# Patient Record
Sex: Male | Born: 1995 | Race: Black or African American | Hispanic: No | Smoking: Never smoker
Health system: Southern US, Community
[De-identification: ages and names within clinical notes are randomized; demographics above are authoritative.]

---
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
## 2017-09-01 ENCOUNTER — Encounter (HOSPITAL_COMMUNITY): Payer: Self-pay

## 2017-09-01 ENCOUNTER — Ambulatory Visit (HOSPITAL_COMMUNITY)
Admission: EM | Admit: 2017-09-01 | Discharge: 2017-09-01 | Disposition: A | Discharge status: Home / Self Care | Payer: 59 | Attending: Family Medicine | Admitting: Family Medicine

## 2017-09-01 DIAGNOSIS — N50819 Testicular pain, unspecified: Secondary | ICD-10-CM | POA: Diagnosis not present

## 2017-09-01 DIAGNOSIS — S301XXA Contusion of abdominal wall, initial encounter: Secondary | ICD-10-CM

## 2017-09-01 DIAGNOSIS — S3022XA Contusion of scrotum and testes, initial encounter: Secondary | ICD-10-CM

## 2017-09-01 DIAGNOSIS — W2103XA Struck by baseball, initial encounter: Secondary | ICD-10-CM

## 2017-09-01 MED ORDER — NAPROXEN 500 MG PO TABS: 500.0000 mg | ORAL_TABLET | Freq: Two times a day (BID) | ORAL | 0 refills | Status: DC

## 2017-09-01 NOTE — ED Provider Notes (Signed)
[facility]   [id number] 09/01/17 Arrival Time: 1816   SUBJECTIVE:  Randall Bailey is a 21 y.o. male who presents to the urgent care with complaint of  testicle pain since Sunday 08/30/2017 while playing baseball as a catcher at school, a ball hit him in his testicles pt states he was wearing a cup but some how the ball went underneath   Seemed to be improving until he went down to the catcher position today and experienced quite a bit of pain      History reviewed. No pertinent past medical history. History reviewed. No pertinent family history. Social History   Social History  . Marital status: Single    Spouse name: N/A  . Number of children: N/A  . Years of education: N/A   Occupational History  . Not on file.   Social History Main Topics  . Smoking status: Never Smoker  . Smokeless tobacco: Never Used  . Alcohol use No  . Drug use: No  . Sexual activity: Yes    Birth control/ protection: Condom   Other Topics Concern  . Not on file   Social History Narrative  . No narrative on file   Current Meds  Medication Sig  . [DISCONTINUED] ibuprofen (ADVIL,MOTRIN) 600 MG tablet 1 tab PO Q6h x 2 days then Q6h prn   No Known Allergies    ROS: As per HPI, remainder of ROS negative.   OBJECTIVE:   Vitals:   09/01/17 1858  BP: 112/66  Pulse: 61  Resp: 15  Temp: 98.3 F (36.8 C)  TempSrc: Oral  SpO2: 98%     General appearance: alert; no distress Eyes: PERRL; EOMI; conjunctiva normal HENT: normocephalic; atraumatic; TMs normal, canal normal, external ears normal without trauma; nasal mucosa normal; oral mucosa normal Neck: supple Lungs: clear to auscultation bilaterally Heart: regular rate and rhythm Abdomen: soft, non-tender; bowel sounds normal; no masses or organomegaly; no guarding or rebound tenderness Back: no CVA tenderness Extremities: no cyanosis or edema; symmetrical with no gross deformities Skin: warm and dry Neurologic:  normal gait; grossly normal Psychological: alert and cooperative; normal mood and affect  Genitalia: No swelling of the testes. Minimal pain of the right testis. Normal penis. No hernia    Labs:  Results for orders placed or performed during the hospital encounter of 07/10/08  Urinalysis, Routine w reflex microscopic  Result Value Ref Range   Color, Urine YELLOW    APPearance CLEAR    Specific Gravity, Urine 1.030    pH 6.0    Glucose, UA NEGATIVE    Hgb urine dipstick NEGATIVE    Bilirubin Urine NEGATIVE    Ketones, ur NEGATIVE    Protein, ur NEGATIVE    Urobilinogen, UA 1.0    Nitrite NEGATIVE    Leukocytes, UA      NEGATIVE MICROSCOPIC NOT DONE ON URINES WITH NEGATIVE PROTEIN, BLOOD, LEUKOCYTES, NITRITE, OR GLUCOSE <1000 mg/dL.    Labs Reviewed - No data to display  No results found.     ASSESSMENT & PLAN:  1. Contusion of flank or groin, initial encounter     Meds ordered this encounter  Medications  . naproxen (NAPROSYN) 500 MG tablet    Sig: Take 1 tablet (500 mg total) by mouth 2 (two) times daily.    Dispense:  30 tablet    Refill:  0    Reviewed expectations re: course of current medical issues. Questions answered. Outlined signs and symptoms indicating need for more  acute intervention. Patient verbalized understanding. After Visit Summary given.    Procedures:      Elvina Sidle, MD 09/01/17 1610

## 2017-09-01 NOTE — ED Triage Notes (Signed)
Patient presents to [facility] with complaints of testicle pain since Sunday 08/30/2017 while playing football at school a ball hit him in his testicles pt states he was wearing a cup but some how the ball went underneath

## 2019-07-23 ENCOUNTER — Other Ambulatory Visit: Payer: Self-pay

## 2019-07-23 DIAGNOSIS — Z20822 Contact with and (suspected) exposure to covid-19: Secondary | ICD-10-CM

## 2019-07-24 LAB — NOVEL CORONAVIRUS, NAA: SARS-CoV-2, NAA: NOT DETECTED

## 2019-08-18 ENCOUNTER — Emergency Department (HOSPITAL_COMMUNITY): Payer: 59

## 2019-08-18 ENCOUNTER — Emergency Department (HOSPITAL_COMMUNITY)
Admission: EM | Admit: 2019-08-18 | Discharge: 2019-08-18 | Disposition: A | Discharge status: Home / Self Care | Payer: 59 | Attending: Emergency Medicine | Admitting: Emergency Medicine

## 2019-08-18 ENCOUNTER — Other Ambulatory Visit: Payer: Self-pay

## 2019-08-18 ENCOUNTER — Encounter (HOSPITAL_COMMUNITY): Payer: Self-pay | Admitting: Emergency Medicine

## 2019-08-18 DIAGNOSIS — S60410A Abrasion of right index finger, initial encounter: Secondary | ICD-10-CM | POA: Diagnosis not present

## 2019-08-18 DIAGNOSIS — Y929 Unspecified place or not applicable: Secondary | ICD-10-CM | POA: Insufficient documentation

## 2019-08-18 DIAGNOSIS — Y9389 Activity, other specified: Secondary | ICD-10-CM | POA: Insufficient documentation

## 2019-08-18 DIAGNOSIS — S60414A Abrasion of right ring finger, initial encounter: Secondary | ICD-10-CM | POA: Diagnosis not present

## 2019-08-18 DIAGNOSIS — S60412A Abrasion of right middle finger, initial encounter: Secondary | ICD-10-CM | POA: Insufficient documentation

## 2019-08-18 DIAGNOSIS — W2209XA Striking against other stationary object, initial encounter: Secondary | ICD-10-CM | POA: Insufficient documentation

## 2019-08-18 DIAGNOSIS — Z79899 Other long term (current) drug therapy: Secondary | ICD-10-CM | POA: Insufficient documentation

## 2019-08-18 DIAGNOSIS — S62666A Nondisplaced fracture of distal phalanx of right little finger, initial encounter for closed fracture: Secondary | ICD-10-CM

## 2019-08-18 DIAGNOSIS — Y999 Unspecified external cause status: Secondary | ICD-10-CM | POA: Diagnosis not present

## 2019-08-18 DIAGNOSIS — S6991XA Unspecified injury of right wrist, hand and finger(s), initial encounter: Secondary | ICD-10-CM | POA: Diagnosis present

## 2019-08-18 NOTE — ED Triage Notes (Signed)
Pt states that he punched a wall and is now complaining of rt hand pain.  Small abrasions noted.

## 2019-08-18 NOTE — Discharge Instructions (Signed)
Take ibuprofen 600 mg every 6 hours as needed for pain. Clean wound with mild soap and warm water. Pat dry and keep clean. Ice your hand over night tonight. This should heal fine and I don't anticipate and long term complications, but I don't recommend you play baseball for 4-6 weeks.

## 2019-08-21 NOTE — ED Provider Notes (Signed)
[facility] DEPT Provider Note   CSN: [id number] Arrival date & time: 08/18/19  2025     History   Chief Complaint Chief Complaint  Patient presents with  . Hand Pain    HPI Randall Bailey is a 23 y.o. male.     HPI   23 year old male with right hand pain after punching a wall.  Pain is primarily in his finger.  Multiple abrasions.  Patient is unsure of tetanus status but is declining update.  Denies any other injuries.  No numbness or tingling.  History reviewed. No pertinent past medical history.  There are no active problems to display for this patient.   No past surgical history on file.      Home Medications    Prior to Admission medications   Medication Sig Start Date End Date Taking? Authorizing Provider  minocycline (MINOCIN,DYNACIN) 50 MG capsule Take 50 mg by mouth 2 (two) times daily.    [provider]  naproxen (NAPROSYN) 500 MG tablet Take 1 tablet (500 mg total) by mouth 2 (two) times daily. 09/01/17   Robyn Haber, MD    Family History No family history on file.  Social History Social History   Tobacco Use  . Smoking status: Never Smoker  . Smokeless tobacco: Never Used  Substance Use Topics  . Alcohol use: No  . Drug use: No     Allergies   Patient has no known allergies.   Review of Systems Review of Systems  All systems reviewed and negative, other than as noted in HPI.  Physical Exam Updated Vital Signs BP 126/87   Pulse 61   Temp 98.2 F (36.8 C) (Oral)   Resp 16   Ht 5\' 10"  (1.778 m)   Wt 76.7 kg   SpO2 100%   BMI 24.25 kg/m   Physical Exam Vitals signs and nursing note reviewed.  Constitutional:      General: He is not in acute distress.    Appearance: He is well-developed.  HENT:     Head: Normocephalic and atraumatic.  Eyes:     General:        Right eye: No discharge.        Left eye: No discharge.     Conjunctiva/sclera: Conjunctivae normal.  Neck:   Musculoskeletal: Neck supple.  Cardiovascular:     Rate and Rhythm: Normal rate and regular rhythm.     Heart sounds: Normal heart sounds. No murmur. No friction rub. No gallop.   Pulmonary:     Effort: Pulmonary effort is normal. No respiratory distress.     Breath sounds: Normal breath sounds.  Abdominal:     General: There is no distension.     Palpations: Abdomen is soft.     Tenderness: There is no abdominal tenderness.  Musculoskeletal:     Comments: Multiple abrasions to the fingers of the right hand.  There is a small hemorrhagic blister over the dorsal aspect of the distal little finger.  Skin is intact though.  Localized enderness.  Skin:    General: Skin is warm and dry.  Neurological:     Mental Status: He is alert.  Psychiatric:        Behavior: Behavior normal.        Thought Content: Thought content normal.      ED Treatments / Results  Labs (all labs ordered are listed, but only abnormal results are displayed) Labs Reviewed - No data to display  EKG  None  Radiology No results found.   Dg Hand Complete Right  Addendum Date: 08/18/2019   ADDENDUM REPORT: 08/18/2019 22:50 ADDENDUM: Upon further review and following discussion with the ordering provider post clinical exam additional findings are as below: There is a displaced, angulated fracture of the fifth distal phalanx with minimal overlying soft tissue swelling. No other fracture or traumatic malalignment. Addendum was called by telephone at the time submission on 08/18/2019 at 10:49 pm to [facility] Spe[facility] , who verbally acknowledged these results. Electronically Signed   By: Kreg ShropshirePrice  DeHay M.D.   On: 08/18/2019 22:50   Result Date: 08/18/2019 CLINICAL DATA:  Punched a wall EXAM: RIGHT HAND - COMPLETE 3+ VIEW COMPARISON:  None. FINDINGS: There is no evidence of fracture or dislocation. There is no evidence of arthropathy or other focal bone abnormality. Soft tissues are unremarkable. IMPRESSION: Negative.  Electronically Signed: By: Kreg ShropshirePrice  DeHay M.D. On: 08/18/2019 20:49    Procedures Procedures (including critical care time)  Medications Ordered in ED Medications - No data to display   Initial Impression / Assessment and Plan / ED Course  I have reviewed the triage vital signs and the nursing notes.  Pertinent labs & imaging results that were available during my care of the patient were reviewed by me and considered in my medical decision making (see chart for details).        23 year old male with right hand/finger pain after punching a wall.  Closed distal phalanx fracture to the little finger.  Neurovascular intact.  Splint.  PRN pain medication.  Outpatient Ortho follow-up.  Final Clinical Impressions(s) / ED Diagnoses   Final diagnoses:  Closed nondisplaced fracture of distal phalanx of right little finger, initial encounter    ED Discharge Orders    None       Raeford RazorKohut, Kaelin Bonelli, MD 08/21/19 1831

## 2019-11-02 ENCOUNTER — Other Ambulatory Visit: Payer: Self-pay

## 2019-11-02 ENCOUNTER — Ambulatory Visit (INDEPENDENT_AMBULATORY_CARE_PROVIDER_SITE_OTHER): Payer: 59 | Admitting: Family Medicine

## 2019-11-02 ENCOUNTER — Encounter: Payer: Self-pay | Admitting: Family Medicine

## 2019-11-02 VITALS — BP 122/79 | HR 56 | Temp 98.8°F | Ht 69.0 in | Wt 162.6 lb

## 2019-11-02 DIAGNOSIS — F411 Generalized anxiety disorder: Secondary | ICD-10-CM | POA: Diagnosis not present

## 2019-11-02 DIAGNOSIS — Z87898 Personal history of other specified conditions: Secondary | ICD-10-CM | POA: Diagnosis not present

## 2019-11-02 DIAGNOSIS — R110 Nausea: Secondary | ICD-10-CM | POA: Diagnosis not present

## 2019-11-02 DIAGNOSIS — G4700 Insomnia, unspecified: Secondary | ICD-10-CM

## 2019-11-02 DIAGNOSIS — F1291 Cannabis use, unspecified, in remission: Secondary | ICD-10-CM

## 2019-11-02 NOTE — Patient Instructions (Addendum)
Melatonin ok at bedtime for now, but follow up with psychiatry as planned this Monday.  Try bedtime of midnight at this time with decreased use of electronic media in the evening.  Try the approach we discussed as far as reading a book if you feel like you cannot sleep.  I will check a few tests for the nausea but would like to discuss that further at follow-up in 2 weeks to decide if gastroenterology visit may also be helpful.  Thank you for coming in to see me today.   Return to the clinic or go to the nearest emergency room if any of your symptoms worsen or new symptoms occur.   Nausea, Adult Nausea is the feeling that you have an upset stomach or that you are about to vomit. Nausea on its own is not usually a serious concern, but it may be an early sign of a more serious medical problem. As nausea gets worse, it can lead to vomiting. If vomiting develops, or if you are not able to drink enough fluids, you are at risk of becoming dehydrated. Dehydration can make you tired and thirsty, cause you to have a dry mouth, and decrease how often you urinate. Older adults and people with other diseases or a weak disease-fighting system (immune system) are at higher risk for dehydration. The main goals of treating your nausea are:  To relieve your nausea.  To limit repeated nausea episodes.  To prevent vomiting and dehydration. Follow these instructions at home: Watch your symptoms for any changes. Tell your health care provider about them. Follow these instructions as told by your health care provider. Eating and drinking      Take an oral rehydration solution (ORS). This is a drink that is sold at pharmacies and retail stores.  Drink clear fluids slowly and in small amounts as you are able. Clear fluids include water, ice chips, low-calorie sports drinks, and fruit juice that has water added (diluted fruit juice).  Eat bland, easy-to-digest foods in small amounts as you are able. These foods  include bananas, applesauce, rice, lean meats, toast, and crackers.  Avoid drinking fluids that contain a lot of sugar or caffeine, such as energy drinks, sports drinks, and soda.  Avoid alcohol.  Avoid spicy or fatty foods. General instructions  Take over-the-counter and prescription medicines only as told by your health care provider.  Rest at home while you recover.  Drink enough fluid to keep your urine pale yellow.  Breathe slowly and deeply when you feel nauseous.  Avoid smelling things that have strong odors.  Wash your hands often using soap and water. If soap and water are not available, use hand sanitizer.  Make sure that all people in your household wash their hands well and often.  Keep all follow-up visits as told by your health care provider. This is important. Contact a health care provider if:  Your nausea gets worse.  Your nausea does not go away after two days.  You vomit.  You cannot drink fluids without vomiting.  You have any of the following: ? New symptoms. ? A fever. ? A headache. ? Muscle cramps. ? A rash. ? Pain while urinating.  You feel light-headed or dizzy. Get help right away if:  You have pain in your chest, neck, arm, or jaw.  You feel extremely weak or you faint.  You have vomit that is bright red or looks like coffee grounds.  You have bloody or black stools or stools  that look like tar.  You have a severe headache, a stiff neck, or both.  You have severe pain, cramping, or bloating in your abdomen.  You have difficulty breathing or are breathing very quickly.  Your heart is beating very quickly.  Your skin feels cold and clammy.  You feel confused.  You have signs of dehydration, such as: ? Dark urine, very little urine, or no urine. ? Cracked lips. ? Dry mouth. ? Sunken eyes. ? Sleepiness. ? Weakness. These symptoms may represent a serious problem that is an emergency. Do not wait to see if the symptoms will  go away. Get medical help right away. Call your local emergency services (911 in the [place] Do not drive yourself to the hospital. Summary  Nausea is the feeling that you have an upset stomach or that you are about to vomit. Nausea on its own is not usually a serious concern, but it may be an early sign of a more serious medical problem.  If vomiting develops, or if you are not able to drink enough fluids, you are at risk of becoming dehydrated.  Follow recommendations for eating and drinking and take over-the-counter and prescription medicines only as told by your health care provider.  Contact a health care provider right away if your symptoms worsen or you have new symptoms.  Keep all follow-up visits as told by your health care provider. This is important. This information is not intended to replace advice given to you by your health care provider. Make sure you discuss any questions you have with your health care provider. Document Released: 01/01/2005 Document Revised: 05/04/2018 Document Reviewed: 05/04/2018 Elsevier Patient Education  2020 [employer]   Insomnia Insomnia is a sleep disorder that makes it difficult to fall asleep or stay asleep. Insomnia can cause fatigue, low energy, difficulty concentrating, mood swings, and poor performance at work or school. There are three different ways to classify insomnia:  Difficulty falling asleep.  Difficulty staying asleep.  Waking up too early in the morning. Any type of insomnia can be long-term (chronic) or short-term (acute). Both are common. Short-term insomnia usually lasts for three months or less. Chronic insomnia occurs at least three times a week for longer than three months. What are the causes? Insomnia may be caused by another condition, situation, or substance, such as:  Anxiety.  Certain medicines.  Gastroesophageal reflux disease (GERD) or other gastrointestinal conditions.  Asthma or other breathing  conditions.  Restless legs syndrome, sleep apnea, or other sleep disorders.  Chronic pain.  Menopause.  Stroke.  Abuse of alcohol, tobacco, or illegal drugs.  Mental health conditions, such as depression.  Caffeine.  Neurological disorders, such as Alzheimer's disease.  An overactive thyroid (hyperthyroidism). Sometimes, the cause of insomnia may not be known. What increases the risk? Risk factors for insomnia include:  Gender. Women are affected more often than men.  Age. Insomnia is more common as you get older.  Stress.  Lack of exercise.  Irregular work schedule or working night shifts.  Traveling between different time zones.  Certain medical and mental health conditions. What are the signs or symptoms? If you have insomnia, the main symptom is having trouble falling asleep or having trouble staying asleep. This may lead to other symptoms, such as:  Feeling fatigued or having low energy.  Feeling nervous about going to sleep.  Not feeling rested in the morning.  Having trouble concentrating.  Feeling irritable, anxious, or depressed. How is this diagnosed? This condition  may be diagnosed based on:  Your symptoms and medical history. Your health care provider may ask about: ? Your sleep habits. ? Any medical conditions you have. ? Your mental health.  A physical exam. How is this treated? Treatment for insomnia depends on the cause. Treatment may focus on treating an underlying condition that is causing insomnia. Treatment may also include:  Medicines to help you sleep.  Counseling or therapy.  Lifestyle adjustments to help you sleep better. Follow these instructions at home: Eating and drinking   Limit or avoid alcohol, caffeinated beverages, and cigarettes, especially close to bedtime. These can disrupt your sleep.  Do not eat a large meal or eat spicy foods right before bedtime. This can lead to digestive discomfort that can make it hard  for you to sleep. Sleep habits   Keep a sleep diary to help you and your health care provider figure out what could be causing your insomnia. Write down: ? When you sleep. ? When you wake up during the night. ? How well you sleep. ? How rested you feel the next day. ? Any side effects of medicines you are taking. ? What you eat and drink.  Make your bedroom a dark, comfortable place where it is easy to fall asleep. ? Put up shades or blackout curtains to block light from outside. ? Use a white noise machine to block noise. ? Keep the temperature cool.  Limit screen use before bedtime. This includes: ? Watching TV. ? Using your smartphone, tablet, or computer.  Stick to a routine that includes going to bed and waking up at the same times every day and night. This can help you fall asleep faster. Consider making a quiet activity, such as reading, part of your nighttime routine.  Try to avoid taking naps during the day so that you sleep better at night.  Get out of bed if you are still awake after 15 minutes of trying to sleep. Keep the lights down, but try reading or doing a quiet activity. When you feel sleepy, go back to bed. General instructions  Take over-the-counter and prescription medicines only as told by your health care provider.  Exercise regularly, as told by your health care provider. Avoid exercise starting several hours before bedtime.  Use relaxation techniques to manage stress. Ask your health care provider to suggest some techniques that may work well for you. These may include: ? Breathing exercises. ? Routines to release muscle tension. ? Visualizing peaceful scenes.  Make sure that you drive carefully. Avoid driving if you feel very sleepy.  Keep all follow-up visits as told by your health care provider. This is important. Contact a health care provider if:  You are tired throughout the day.  You have trouble in your daily routine due to sleepiness.   You continue to have sleep problems, or your sleep problems get worse. Get help right away if:  You have serious thoughts about hurting yourself or someone else. If you ever feel like you may hurt yourself or others, or have thoughts about taking your own life, get help right away. You can go to your nearest emergency department or call:  Your local emergency services (911 in the [place]  A suicide crisis helpline, such as the National Suicide Prevention Lifeline at 475-661-5843. This is open 24 hours a day. Summary  Insomnia is a sleep disorder that makes it difficult to fall asleep or stay asleep.  Insomnia can be long-term (chronic) or short-term (acute).  Treatment for insomnia depends on the cause. Treatment may focus on treating an underlying condition that is causing insomnia.  Keep a sleep diary to help you and your health care provider figure out what could be causing your insomnia. This information is not intended to replace advice given to you by your health care provider. Make sure you discuss any questions you have with your health care provider. Document Released: 11/21/2000 Document Revised: 11/06/2017 Document Reviewed: 09/03/2017 Elsevier Patient Education  [employer]2020 [employer]     If you have lab work done today you will be contacted with your lab results within the next 2 weeks.  If you have not heard from us then please contact us. The fastest way to get your results is to register for My Chart.   IF you received an x-ray today, you will receive an invoice from [facility]. Please contact [facility] at [phone] with questions or concerns regarding your invoice.   IF you received labwork today, you will receive an invoice from [facility]. Please contact [facility] at [phone] with questions or concerns regarding your invoice.   Our billing staff will not be able to assist you with questions regarding bills from these companies.  You will be  contacted with the lab results as soon as they are available. The fastest way to get your results is to activate your My Chart account. Instructions are located on the last page of this paperwork. If you have not heard from us regarding the results in 2 weeks, please contact this office.   ']

## 2019-11-02 NOTE — Progress Notes (Signed)
Subjective:  Patient ID: Randall Bailey, male    DOB: [birth year]  Age: 23 y.o. MRN: [id number]  CC:  Chief Complaint  Patient presents with  . Establish Care    patient is here to est care and want to talk about my anxiety/insomnia    HPI Randall Bailey presents for Establish care and anxiety/sleep issues.   Anxiety: past few years. Self medicating with marijuana past few years - daily smoking, but not working as well lately.  Only eating once per day - has anxiety when thinking about food. Feels nauseous if eats too much. Prior abdominal pain. No actual vomiting - just nausea. No history of eating d/o. coach and mom would like him to gain weight - he does not want to gain weight - just muscle.  Eating only once per day - around 3-5 pm.  Weighed more "chubby" in elementary school to 7th grade. Not concerned about that, denies body image issues.  Would smoke when anxious than able to eat more meals per day.  Off marijuana past 6 days - positive drug screen at school - has to pass 2 drug tests. 1 game suspension. Denies other banned substance use. Denies addiction to marijuana, but helped with meals and sleep. Persistent nausea off marijuana, no abd pain  (prior abd pain few months ago).  No SI/attempts. Likes to be with people - no social anxiety/agoraphobia.  Has been meeting with counselor at school for past 2 months since punched wall and broke hand. referred to school psychiatrist - will be seen this Monday.  Some school stress.   Trouble getting to sleep and frequent wakening. Sleeping 2-4 hours. tired and irritable during the day.  No naps. Few years of decreased sleep  Denies manic symptoms, denies elated mood, no personal or FH of Bipolar d/o.  No relief with sleep with CBD oil.  Melatonin stick/vape - 2 days.  Took xanax in HS (not prescribed).  No recent benzos - few years ago.  No set bedtime, goes to sleep around 5, awake at 8-9.   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   Has not met with sports nutritionist at [employer].   Depression screen [facility] 2/9 11/02/2019  Decreased Interest 0  Down, Depressed, Hopeless 0  PHQ - 2 Score 0    GAD 7 : Generalized Anxiety Score 11/02/2019  Nervous, Anxious, on Edge 3  Control/stop worrying 1  Worry too much - different things 2  Trouble relaxing 3  Restless 3  Easily annoyed or irritable 3  Afraid - awful might happen 2  Total GAD 7 Score 17        History There are no active problems to display for this patient.  No past medical history on file. No past surgical history on file. No Known Allergies Prior to Admission medications   Medication Sig Start Date End Date Taking? Authorizing Provider  minocycline (MINOCIN,DYNACIN) 50 MG capsule Take 50 mg by mouth 2 (two) times daily.    [provider]  naproxen (NAPROSYN) 500 MG tablet Take 1 tablet (500 mg total) by mouth 2 (two) times daily. 09/01/17   Robyn Haber, MD   Social History   Socioeconomic History  . Marital status: Single    Spouse name: Not on file  . Number of children: Not on file  . Years of education: Not on file  . Highest education level: Not on file  Occupational History  . Not on  file  Social Needs  . Financial resource strain: Not on file  . Food insecurity    Worry: Not on file    Inability: Not on file  . Transportation needs    Medical: Not on file    Non-medical: Not on file  Tobacco Use  . Smoking status: Never Smoker  . Smokeless tobacco: Never Used  Substance and Sexual Activity  . Alcohol use: No  . Drug use: No  . Sexual activity: Yes    Birth control/protection: Condom  Lifestyle  . Physical activity    Days per week: Not on file    Minutes per session: Not on file  . Stress: Not on file  Relationships  . Social Herbalist on phone: Not on file    Gets together: Not on file    Attends religious service: Not on file    Active member of club or organization: Not on file     Attends meetings of clubs or organizations: Not on file    Relationship status: Not on file  . Intimate partner violence    Fear of current or ex partner: Not on file    Emotionally abused: Not on file    Physically abused: Not on file    Forced sexual activity: Not on file  Other Topics Concern  . Not on file  Social History Narrative  . Not on file    Review of Systems Per HPI.   Objective:   Vitals:   11/02/19 1329  BP: 122/79  Pulse: (!) 56  Temp: 98.8 F (37.1 C)  TempSrc: Oral  SpO2: 100%  Weight: 162 lb 9.6 oz (73.8 kg)  Height: '5\' 9"'  (1.753 m)     Physical Exam Vitals signs reviewed.  Constitutional:      Appearance: He is well-developed.  HENT:     Head: Normocephalic and atraumatic.  Eyes:     Pupils: Pupils are equal, round, and reactive to light.  Neck:     Vascular: No carotid bruit or JVD.     Comments: No thyromegaly Cardiovascular:     Rate and Rhythm: Normal rate and regular rhythm.     Heart sounds: Normal heart sounds. No murmur.  Pulmonary:     Effort: Pulmonary effort is normal.     Breath sounds: Normal breath sounds. No rales.  Skin:    General: Skin is warm and dry.  Neurological:     Mental Status: He is alert and oriented to person, place, and time.  Psychiatric:        Attention and Perception: Attention normal.        Mood and Affect: Affect is blunt.        Speech: Speech normal.        Behavior: Behavior normal.        Thought Content: Thought content normal. Thought content does not include homicidal or suicidal ideation.        Assessment & Plan:  Randall Bailey is a 23 y.o. male . Anxiety state - Plan: TSH  Insomnia, unspecified type  History of marijuana use  Nausea without vomiting - Plan: Lipase, Comprehensive metabolic panel, CBC  History of abdominal pain - Plan: Lipase, Comprehensive metabolic panel, CBC  Suspected longstanding anxiety, but appears to have food focus.  Denies history of eating  disorder, body image concerns.   Suspect self treatment with marijuana, now has stopped due to recent positive drug screen through school. Working with therapist,  and plan follow-up with psychiatry in 3 days.  Deferred new medication at this time.   Sleep hygiene reviewed, melatonin discussed.  Check labs for nausea, but may need GI eval. As above some anxiety around eating which may stem from nausea. Less likely cannabinoid hyperemesis syndrome. Recheck 2 weeks.    No orders of the defined types were placed in this encounter.  Patient Instructions    Melatonin ok at bedtime for now, but follow up with psychiatry as planned this Monday.  Try bedtime of midnight at this time with decreased use of electronic media in the evening.  Try the approach we discussed as far as reading a book if you feel like you cannot sleep.  I will check a few tests for the nausea but would like to discuss that further at follow-up in 2 weeks to decide if gastroenterology visit may also be helpful.  Thank you for coming in to see me today.   Return to the clinic or go to the nearest emergency room if any of your symptoms worsen or new symptoms occur.   Nausea, Adult Nausea is the feeling that you have an upset stomach or that you are about to vomit. Nausea on its own is not usually a serious concern, but it may be an early sign of a more serious medical problem. As nausea gets worse, it can lead to vomiting. If vomiting develops, or if you are not able to drink enough fluids, you are at risk of becoming dehydrated. Dehydration can make you tired and thirsty, cause you to have a dry mouth, and decrease how often you urinate. Older adults and people with other diseases or a weak disease-fighting system (immune system) are at higher risk for dehydration. The main goals of treating your nausea are:  To relieve your nausea.  To limit repeated nausea episodes.  To prevent vomiting and dehydration. Follow these  instructions at home: Watch your symptoms for any changes. Tell your health care provider about them. Follow these instructions as told by your health care provider. Eating and drinking      Take an oral rehydration solution (ORS). This is a drink that is sold at pharmacies and retail stores.  Drink clear fluids slowly and in small amounts as you are able. Clear fluids include water, ice chips, low-calorie sports drinks, and fruit juice that has water added (diluted fruit juice).  Eat bland, easy-to-digest foods in small amounts as you are able. These foods include bananas, applesauce, rice, lean meats, toast, and crackers.  Avoid drinking fluids that contain a lot of sugar or caffeine, such as energy drinks, sports drinks, and soda.  Avoid alcohol.  Avoid spicy or fatty foods. General instructions  Take over-the-counter and prescription medicines only as told by your health care provider.  Rest at home while you recover.  Drink enough fluid to keep your urine pale yellow.  Breathe slowly and deeply when you feel nauseous.  Avoid smelling things that have strong odors.  Wash your hands often using soap and water. If soap and water are not available, use hand sanitizer.  Make sure that all people in your household wash their hands well and often.  Keep all follow-up visits as told by your health care provider. This is important. Contact a health care provider if:  Your nausea gets worse.  Your nausea does not go away after two days.  You vomit.  You cannot drink fluids without vomiting.  You have any of the following: ?  New symptoms. ? A fever. ? A headache. ? Muscle cramps. ? A rash. ? Pain while urinating.  You feel light-headed or dizzy. Get help right away if:  You have pain in your chest, neck, arm, or jaw.  You feel extremely weak or you faint.  You have vomit that is bright red or looks like coffee grounds.  You have bloody or black stools or stools  that look like tar.  You have a severe headache, a stiff neck, or both.  You have severe pain, cramping, or bloating in your abdomen.  You have difficulty breathing or are breathing very quickly.  Your heart is beating very quickly.  Your skin feels cold and clammy.  You feel confused.  You have signs of dehydration, such as: ? Dark urine, very little urine, or no urine. ? Cracked lips. ? Dry mouth. ? Sunken eyes. ? Sleepiness. ? Weakness. These symptoms may represent a serious problem that is an emergency. Do not wait to see if the symptoms will go away. Get medical help right away. Call your local emergency services (911 in the [place] Do not drive yourself to the hospital. Summary  Nausea is the feeling that you have an upset stomach or that you are about to vomit. Nausea on its own is not usually a serious concern, but it may be an early sign of a more serious medical problem.  If vomiting develops, or if you are not able to drink enough fluids, you are at risk of becoming dehydrated.  Follow recommendations for eating and drinking and take over-the-counter and prescription medicines only as told by your health care provider.  Contact a health care provider right away if your symptoms worsen or you have new symptoms.  Keep all follow-up visits as told by your health care provider. This is important. This information is not intended to replace advice given to you by your health care provider. Make sure you discuss any questions you have with your health care provider. Document Released: 01/01/2005 Document Revised: 05/04/2018 Document Reviewed: 05/04/2018 Elsevier Patient Education  2020 [place].   Insomnia Insomnia is a sleep disorder that makes it difficult to fall asleep or stay asleep. Insomnia can cause fatigue, low energy, difficulty concentrating, mood swings, and poor performance at work or school. There are three different ways to classify insomnia:   Difficulty falling asleep.  Difficulty staying asleep.  Waking up too early in the morning. Any type of insomnia can be long-term (chronic) or short-term (acute). Both are common. Short-term insomnia usually lasts for three months or less. Chronic insomnia occurs at least three times a week for longer than three months. What are the causes? Insomnia may be caused by another condition, situation, or substance, such as:  Anxiety.  Certain medicines.  Gastroesophageal reflux disease (GERD) or other gastrointestinal conditions.  Asthma or other breathing conditions.  Restless legs syndrome, sleep apnea, or other sleep disorders.  Chronic pain.  Menopause.  Stroke.  Abuse of alcohol, tobacco, or illegal drugs.  Mental health conditions, such as depression.  Caffeine.  Neurological disorders, such as Alzheimer's disease.  An overactive thyroid (hyperthyroidism). Sometimes, the cause of insomnia may not be known. What increases the risk? Risk factors for insomnia include:  Gender. Women are affected more often than men.  Age. Insomnia is more common as you get older.  Stress.  Lack of exercise.  Irregular work schedule or working night shifts.  Traveling between different time zones.  Certain medical and mental  health conditions. What are the signs or symptoms? If you have insomnia, the main symptom is having trouble falling asleep or having trouble staying asleep. This may lead to other symptoms, such as:  Feeling fatigued or having low energy.  Feeling nervous about going to sleep.  Not feeling rested in the morning.  Having trouble concentrating.  Feeling irritable, anxious, or depressed. How is this diagnosed? This condition may be diagnosed based on:  Your symptoms and medical history. Your health care provider may ask about: ? Your sleep habits. ? Any medical conditions you have. ? Your mental health.  A physical exam. How is this treated?  Treatment for insomnia depends on the cause. Treatment may focus on treating an underlying condition that is causing insomnia. Treatment may also include:  Medicines to help you sleep.  Counseling or therapy.  Lifestyle adjustments to help you sleep better. Follow these instructions at home: Eating and drinking   Limit or avoid alcohol, caffeinated beverages, and cigarettes, especially close to bedtime. These can disrupt your sleep.  Do not eat a large meal or eat spicy foods right before bedtime. This can lead to digestive discomfort that can make it hard for you to sleep. Sleep habits   Keep a sleep diary to help you and your health care provider figure out what could be causing your insomnia. Write down: ? When you sleep. ? When you wake up during the night. ? How well you sleep. ? How rested you feel the next day. ? Any side effects of medicines you are taking. ? What you eat and drink.  Make your bedroom a dark, comfortable place where it is easy to fall asleep. ? Put up shades or blackout curtains to block light from outside. ? Use a white noise machine to block noise. ? Keep the temperature cool.  Limit screen use before bedtime. This includes: ? Watching TV. ? Using your smartphone, tablet, or computer.  Stick to a routine that includes going to bed and waking up at the same times every day and night. This can help you fall asleep faster. Consider making a quiet activity, such as reading, part of your nighttime routine.  Try to avoid taking naps during the day so that you sleep better at night.  Get out of bed if you are still awake after 15 minutes of trying to sleep. Keep the lights down, but try reading or doing a quiet activity. When you feel sleepy, go back to bed. General instructions  Take over-the-counter and prescription medicines only as told by your health care provider.  Exercise regularly, as told by your health care provider. Avoid exercise starting  several hours before bedtime.  Use relaxation techniques to manage stress. Ask your health care provider to suggest some techniques that may work well for you. These may include: ? Breathing exercises. ? Routines to release muscle tension. ? Visualizing peaceful scenes.  Make sure that you drive carefully. Avoid driving if you feel very sleepy.  Keep all follow-up visits as told by your health care provider. This is important. Contact a health care provider if:  You are tired throughout the day.  You have trouble in your daily routine due to sleepiness.  You continue to have sleep problems, or your sleep problems get worse. Get help right away if:  You have serious thoughts about hurting yourself or someone else. If you ever feel like you may hurt yourself or others, or have thoughts about taking your own life,  get help right away. You can go to your nearest emergency department or call:  Your local emergency services (911 in the [place]  A suicide crisis helpline, such as the [place] at 706 186 2645. This is open 24 hours a day. Summary  Insomnia is a sleep disorder that makes it difficult to fall asleep or stay asleep.  Insomnia can be long-term (chronic) or short-term (acute).  Treatment for insomnia depends on the cause. Treatment may focus on treating an underlying condition that is causing insomnia.  Keep a sleep diary to help you and your health care provider figure out what could be causing your insomnia. This information is not intended to replace advice given to you by your health care provider. Make sure you discuss any questions you have with your health care provider. Document Released: 11/21/2000 Document Revised: 11/06/2017 Document Reviewed: 09/03/2017 Elsevier Patient Education  [employer].     If you have lab work done today you will be contacted with your lab results within the next 2 weeks.  If you have not heard from  [place] then please contact us. The fastest way to get your results is to register for My Chart.   IF you received an x-ray today, you will receive an invoice from [facility]. Please contact [facility] at [phone] with questions or concerns regarding your invoice.   IF you received labwork today, you will receive an invoice from [facility]. Please contact [facility] at [phone] with questions or concerns regarding your invoice.   Our billing staff will not be able to assist you with questions regarding bills from these companies.  You will be contacted with the lab results as soon as they are available. The fastest way to get your results is to activate your My Chart account. Instructions are located on the last page of this paperwork. If you have not heard from [place] regarding the results in 2 weeks, please contact this office.   ']      Signed, Merri Ray, MD [facility]

## 2019-11-03 LAB — COMPREHENSIVE METABOLIC PANEL
ALT: 21 IU/L (ref 0–44)
AST: 23 IU/L (ref 0–40)
Albumin/Globulin Ratio: 1.9 (ref 1.2–2.2)
Albumin: 4.6 g/dL (ref 4.1–5.2)
Alkaline Phosphatase: 83 IU/L (ref 39–117)
BUN/Creatinine Ratio: 8 — ABNORMAL LOW (ref 9–20)
BUN: 10 mg/dL (ref 6–20)
Bilirubin Total: 0.6 mg/dL (ref 0.0–1.2)
CO2: 22 mmol/L (ref 20–29)
Calcium: 9.5 mg/dL (ref 8.7–10.2)
Chloride: 103 mmol/L (ref 96–106)
Creatinine, Ser: 1.18 mg/dL (ref 0.76–1.27)
GFR calc Af Amer: 102 mL/min/{1.73_m2} (ref >59)
GFR calc non Af Amer: 88 mL/min/{1.73_m2} (ref >59)
Globulin, Total: 2.4 g/dL (ref 1.5–4.5)
Glucose: 76 mg/dL (ref 65–99)
Potassium: 4.2 mmol/L (ref 3.5–5.2)
Sodium: 140 mmol/L (ref 134–144)
Total Protein: 7 g/dL (ref 6.0–8.5)

## 2019-11-03 LAB — CBC
Hematocrit: 43 % (ref 37.5–51.0)
Hemoglobin: 14.4 g/dL (ref 13.0–17.7)
MCH: 30.8 pg (ref 26.6–33.0)
MCHC: 33.5 g/dL (ref 31.5–35.7)
MCV: 92 fL (ref 79–97)
Platelets: 269 10*3/uL (ref 150–450)
RBC: 4.67 x10E6/uL (ref 4.14–5.80)
RDW: 12.2 % (ref 11.6–15.4)
WBC: 8.8 10*3/uL (ref 3.4–10.8)

## 2019-11-03 LAB — TSH: TSH: 0.647 u[IU]/mL (ref 0.450–4.500)

## 2019-11-03 LAB — LIPASE: Lipase: 34 U/L (ref 13–78)

## 2019-11-05 ENCOUNTER — Encounter: Payer: Self-pay | Admitting: Family Medicine

## 2019-11-16 ENCOUNTER — Ambulatory Visit (INDEPENDENT_AMBULATORY_CARE_PROVIDER_SITE_OTHER): Payer: 59 | Admitting: Family Medicine

## 2019-11-16 ENCOUNTER — Other Ambulatory Visit: Payer: Self-pay

## 2019-11-16 VITALS — BP 137/78 | HR 63 | Temp 98.1°F | Wt 158.4 lb

## 2019-11-16 DIAGNOSIS — G4700 Insomnia, unspecified: Secondary | ICD-10-CM

## 2019-11-16 DIAGNOSIS — F411 Generalized anxiety disorder: Secondary | ICD-10-CM

## 2019-11-16 NOTE — Progress Notes (Signed)
Subjective:  Patient ID: Randall Bailey, male    DOB: 21-Apr-1996  Age: 23 y.o. MRN: [id number]  CC:  Chief Complaint  Patient presents with  . Nausea    2week f/u for neusea. Patient was given meds from [employer] student serive and seems like the sertraline is helping but the thr hydroxyzine do not seem to be helping. GAD7=11 And PHQ9=7    HPI Randall Bailey presents for    Follow-up anxiety, nausea.  Evaluated on the 25th.  Had some food avoidance at that time, anxiety, marijuana use to help with the symptoms in the past.  Positive urine drug screen through school, stop marijuana use.  Counseling was arranged through school and met with psychiatrist after our last visit.  Started on sertraline for anxiety, hydroxyzine for sleep.  On meds past 10 days. More calm. Anxiety better, but still some difficulty with sleep. No change with 66m hydroxyzine. Has not discussed higher dose with [place] No worsening of insomnia. No manic symptoms.   Nausea improved. Has more of an appetite now. Eating more food now. Prior once per day, now 2-3 times per day. No n/v. No abd pain.  [place] feels better than last visit.   Restless after about 30 min - gets on phone or plays game until tired.   Wt Readings from Last 3 Encounters:  11/16/19 158 lb 6.4 oz (71.8 kg)  11/02/19 162 lb 9.6 oz (73.8 kg)  08/18/19 169 lb (76.7 kg)   generalized anxiety disorder [facility] 11/16/2019 11/02/2019  Decreased Interest 0 0  Down, Depressed, Hopeless 1 0  PHQ - 2 Score 1 0  Altered sleeping 3 -  Tired, decreased energy 0 -  Change in appetite 2 -  Feeling bad or failure about yourself  1 -  Trouble concentrating 0 -  Moving slowly or fidgety/restless 0 -  Suicidal thoughts 0 -  PHQ-9 Score 7 -    [facility] 11/16/2019 11/02/2019  Decreased Interest 0 0  Down, Depressed, Hopeless 1 0  PHQ - 2 Score 1 0  Altered sleeping 3 -  Tired, decreased energy 0 -  Change in  appetite 2 -  Feeling bad or failure about yourself  1 -  Trouble concentrating 0 -  Moving slowly or fidgety/restless 0 -  Suicidal thoughts 0 -  PHQ-9 Score 7 -  no SI.      History There are no problems to display for this patient.  No past medical history on file. No past surgical history on file. No Known Allergies Prior to Admission medications   Medication Sig Start Date End Date Taking? Authorizing Provider  hydrOXYzine (ATARAX/VISTARIL) 25 MG tablet Take 25 mg by mouth 3 (three) times daily as needed.   Yes [provider]  sertraline (ZOLOFT) 25 MG tablet Take 25 mg by mouth daily.   Yes [provider]   Social History   Socioeconomic History  . Marital status: Single    Spouse name: Not on file  . Number of children: Not on file  . Years of education: Not on file  . Highest education level: Not on file  Occupational History  . Not on file  Tobacco Use  . Smoking status: Never Smoker  . Smokeless tobacco: Never Used  Substance and Sexual Activity  . Alcohol use: No  . Drug use: No  . Sexual activity: Yes    Birth control/protection: Condom  Other Topics Concern  .  Not on file  Social History Narrative  . Not on file   Social Determinants of Health   Financial Resource Strain:   . Difficulty of Paying Living Expenses: Not on file  Food Insecurity:   . Worried About Charity fundraiser in the Last Year: Not on file  . Ran Out of Food in the Last Year: Not on file  Transportation Needs:   . Lack of Transportation (Medical): Not on file  . Lack of Transportation (Non-Medical): Not on file  Physical Activity:   . Days of Exercise per Week: Not on file  . Minutes of Exercise per Session: Not on file  Stress:   . Feeling of Stress : Not on file  Social Connections:   . Frequency of Communication with Friends and Family: Not on file  . Frequency of Social Gatherings with Friends and Family: Not on file  . Attends Religious Services:  Not on file  . Active Member of Clubs or Organizations: Not on file  . Attends Archivist Meetings: Not on file  . Marital Status: Not on file  Intimate Partner Violence:   . Fear of Current or Ex-Partner: Not on file  . Emotionally Abused: Not on file  . Physically Abused: Not on file  . Sexually Abused: Not on file    Review of Systems  Per HPI.  Objective:   Vitals:   11/16/19 1423  BP: 137/78  Pulse: 63  Temp: 98.1 F (36.7 C)  TempSrc: Temporal  SpO2: 100%  Weight: 158 lb 6.4 oz (71.8 kg)     Physical Exam Constitutional:      General: He is not in acute distress.    Appearance: He is well-developed.  HENT:     Head: Normocephalic and atraumatic.  Cardiovascular:     Rate and Rhythm: Normal rate.  Pulmonary:     Effort: Pulmonary effort is normal.     Breath sounds: Normal breath sounds.  Abdominal:     General: Abdomen is flat. There is no distension.     Tenderness: There is no abdominal tenderness.  Neurological:     Mental Status: He is alert and oriented to person, place, and time.  Psychiatric:        Attention and Perception: Attention normal.        Mood and Affect: Mood and affect normal.        Speech: Speech normal.        Behavior: Behavior normal. Behavior is cooperative.        Thought Content: Thought content normal. Thought content does not include homicidal or suicidal ideation.        Assessment & Plan:  Randall Bailey is a 23 y.o. male . Insomnia, unspecified type  Anxiety state Overall improving with sertraline, counseling.  Appetite is increased.  No further nausea/abdominal pain.  Continue same dose sertraline, recommended he discuss dosing options of hydroxyzine with his mental health provider for persistent insomnia but recommended against use of electronic media late at night.instead can read a book if he is unable to sleep in bed after 15 to 20 minutes.  Other sleep hygiene reviewed. Recheck 1 month.  Consider GI  eval if return of nausea/abdominal symptoms.  No orders of the defined types were placed in this encounter.  Patient Instructions     Try reading if difficulty fall asleep as we discussed.  Try to avoid electronic media late at night.  Follow-up with mental health provider regarding  insomnia and dosing of hydroxyzine or other medications.  Follow-up with me in 1 month.  Take care.  If you have lab work done today you will be contacted with your lab results within the next 2 weeks.  If you have not heard from [place] then please contact us. The fastest way to get your results is to register for My Chart.   IF you received an x-ray today, you will receive an invoice from [facility]. Please contact [facility] at [phone] with questions or concerns regarding your invoice.   IF you received labwork today, you will receive an invoice from [facility]. Please contact [facility] at [phone] with questions or concerns regarding your invoice.   Our billing staff will not be able to assist you with questions regarding bills from these companies.  You will be contacted with the lab results as soon as they are available. The fastest way to get your results is to activate your My Chart account. Instructions are located on the last page of this paperwork. If you have not heard from [place] regarding the results in 2 weeks, please contact this office.          Signed, Merri Ray, MD [facility]

## 2019-11-16 NOTE — Patient Instructions (Addendum)
   Try reading if difficulty fall asleep as we discussed.  Try to avoid electronic media late at night.  Follow-up with mental health provider regarding insomnia and dosing of hydroxyzine or other medications.  Follow-up with me in 1 month.  Take care.  If you have lab work done today you will be contacted with your lab results within the next 2 weeks.  If you have not heard from [place] then please contact us. The fastest way to get your results is to register for My Chart.   IF you received an x-ray today, you will receive an invoice from [facility]. Please contact [facility] at [phone] with questions or concerns regarding your invoice.   IF you received labwork today, you will receive an invoice from [facility]. Please contact [facility] at [phone] with questions or concerns regarding your invoice.   Our billing staff will not be able to assist you with questions regarding bills from these companies.  You will be contacted with the lab results as soon as they are available. The fastest way to get your results is to activate your My Chart account. Instructions are located on the last page of this paperwork. If you have not heard from [place] regarding the results in 2 weeks, please contact this office.

## 2019-11-17 ENCOUNTER — Encounter: Payer: Self-pay | Admitting: Family Medicine

## 2019-12-19 ENCOUNTER — Other Ambulatory Visit: Payer: Self-pay

## 2019-12-19 ENCOUNTER — Ambulatory Visit (INDEPENDENT_AMBULATORY_CARE_PROVIDER_SITE_OTHER): Payer: 59 | Admitting: Family Medicine

## 2019-12-19 ENCOUNTER — Encounter: Payer: Self-pay | Admitting: Family Medicine

## 2019-12-19 VITALS — BP 134/78 | HR 76 | Temp 97.7°F | Ht 69.0 in | Wt 172.4 lb

## 2019-12-19 DIAGNOSIS — R110 Nausea: Secondary | ICD-10-CM

## 2019-12-19 DIAGNOSIS — F411 Generalized anxiety disorder: Secondary | ICD-10-CM

## 2019-12-19 DIAGNOSIS — G4700 Insomnia, unspecified: Secondary | ICD-10-CM | POA: Diagnosis not present

## 2019-12-19 NOTE — Progress Notes (Signed)
Subjective:  Patient ID: Randall Bailey, male    DOB: 1996-10-24  Age: 24 y.o. MRN: [id number]  CC:  Chief Complaint  Patient presents with  . Follow-up    appite is back to normal, pt is no longer feeling nasus. pt states their is nothing else he needs to discuss at this time.    HPI [name] presents for   Nausea, insomnia, anxiety.  Improving at the symptomatic visit.  Now on sertraline, followed by counseling.  Appetite has improved.  No further nausea/abdominal pain.  He was continued on same dose of sertraline, hydroxyzine has been used for insomnia, recommended he discuss various dosing options with his mental health provider.  Sleep hygiene including avoidance of electronic media late at night was discussed.  Taking melatonin - has been doing ok.  Sleeping better.  Has another med if needed form mental health provider.   No further nausea, abd pain. Still eating 2 meals per day. Has discussed appetite with mental health provider. Ok with current weight.   Ran out of sertraline for a week. Vivid dreams. No other symptoms. Plans on restarting - has to pick up Rx tomorrow.  Anxiety better on sertraline.   Ongoing meetings with therapist - missed last week.   appt with psych - unknown. (at school)   No recent marijuana.    Wt Readings from Last 3 Encounters:  12/19/19 172 lb 6.4 oz (78.2 kg)  11/16/19 158 lb 6.4 oz (71.8 kg)  11/02/19 162 lb 9.6 oz (73.8 kg)  Body mass index is 25.46 kg/m.  Depression screen [facility] 2/9 12/19/2019 11/16/2019 11/02/2019  Decreased Interest 0 0 0  Down, Depressed, Hopeless 0 1 0  PHQ - 2 Score 0 1 0  Altered sleeping - 3 -  Tired, decreased energy - 0 -  Change in appetite - 2 -  Feeling bad or failure about yourself  - 1 -  Trouble concentrating - 0 -  Moving slowly or fidgety/restless - 0 -  Suicidal thoughts - 0 -  PHQ-9 Score - 7 -    History There are no problems to display for this patient.  No past medical history  on file. No past surgical history on file. No Known Allergies Prior to Admission medications   Medication Sig Start Date End Date Taking? Authorizing Provider  sertraline (ZOLOFT) 25 MG tablet Take 25 mg by mouth daily.   Yes [provider]  hydrOXYzine (ATARAX/VISTARIL) 25 MG tablet Take 25 mg by mouth 3 (three) times daily as needed.    [provider]   Social History   Socioeconomic History  . Marital status: Single    Spouse name: Not on file  . Number of children: Not on file  . Years of education: Not on file  . Highest education level: Not on file  Occupational History  . Not on file  Tobacco Use  . Smoking status: Never Smoker  . Smokeless tobacco: Never Used  Substance and Sexual Activity  . Alcohol use: No  . Drug use: No  . Sexual activity: Yes    Birth control/protection: Condom  Other Topics Concern  . Not on file  Social History Narrative  . Not on file   Social Determinants of Health   Financial Resource Strain:   . Difficulty of Paying Living Expenses: Not on file  Food Insecurity:   . Worried About Programme researcher, broadcasting/film/video in the Last Year: Not on file  . Ran  Out of Food in the Last Year: Not on file  Transportation Needs:   . Lack of Transportation (Medical): Not on file  . Lack of Transportation (Non-Medical): Not on file  Physical Activity:   . Days of Exercise per Week: Not on file  . Minutes of Exercise per Session: Not on file  Stress:   . Feeling of Stress : Not on file  Social Connections:   . Frequency of Communication with Friends and Family: Not on file  . Frequency of Social Gatherings with Friends and Family: Not on file  . Attends Religious Services: Not on file  . Active Member of Clubs or Organizations: Not on file  . Attends Archivist Meetings: Not on file  . Marital Status: Not on file  Intimate Partner Violence:   . Fear of Current or Ex-Partner: Not on file  . Emotionally Abused: Not on file  .  Physically Abused: Not on file  . Sexually Abused: Not on file    Review of Systems  No SI/HI. Other per HPI.   Objective:   Vitals:   12/19/19 1329  BP: 134/78  Pulse: 76  Temp: 97.7 F (36.5 C)  TempSrc: Temporal  SpO2: 97%  Weight: 172 lb 6.4 oz (78.2 kg)  Height: 5\' 9"  (1.753 m)     Physical Exam Vitals reviewed.  Constitutional:      General: He is not in acute distress.    Appearance: He is well-developed.  HENT:     Head: Normocephalic and atraumatic.  Cardiovascular:     Rate and Rhythm: Normal rate.  Pulmonary:     Effort: Pulmonary effort is normal.  Abdominal:     General: Abdomen is flat. Bowel sounds are normal. There is no distension.     Palpations: There is no mass.     Tenderness: There is no abdominal tenderness. There is no guarding or rebound.     Hernia: No hernia is present.  Neurological:     Mental Status: He is alert and oriented to person, place, and time.  Psychiatric:        Mood and Affect: Mood normal.        Behavior: Behavior normal.        Thought Content: Thought content normal.        Assessment & Plan:  [name] is a 24 y.o. male . Anxiety state  Insomnia, unspecified type  Nausea without vomiting  Improved.  Continue follow-up with mental health provider.  Discussed serotonin withdrawal, restart sertraline and continue follow-up with mental health.  If decreased appetite, return of nausea, or other new symptoms return for recheck.  No orders of the defined types were placed in this encounter.  Patient Instructions       If you have lab work done today you will be contacted with your lab results within the next 2 weeks.  If you have not heard from [place] then please contact us. The fastest way to get your results is to register for My Chart.   IF you received an x-ray today, you will receive an invoice from [facility]. Please contact [facility] at [phone] with questions or concerns  regarding your invoice.   IF you received labwork today, you will receive an invoice from [facility]. Please contact [facility] at [phone] with questions or concerns regarding your invoice.   Our billing staff will not be able to assist you with questions regarding bills from these companies.  You will  be contacted with the lab results as soon as they are available. The fastest way to get your results is to activate your My Chart account. Instructions are located on the last page of this paperwork. If you have not heard from [place] regarding the results in 2 weeks, please contact this office.         Signed, Merri Ray, MD [facility]

## 2019-12-19 NOTE — Patient Instructions (Addendum)
  Continue sertraline, restart that as soon as possible.  Keep follow-up with your mental health provider.  If there is any return of nausea or abdominal symptoms follow-up with me (otherwise okay to continue with healthcare provider at school).  Thank you for coming in today and take care.    If you have lab work done today you will be contacted with your lab results within the next 2 weeks.  If you have not heard from [place] then please contact us. The fastest way to get your results is to register for My Chart.   IF you received an x-ray today, you will receive an invoice from [facility]. Please contact [facility] Radiology at [phone] with questions or concerns regarding your invoice.   IF you received labwork today, you will receive an invoice from [place]. Please contact [facility] at [phone] with questions or concerns regarding your invoice.   Our billing staff will not be able to assist you with questions regarding bills from these companies.  You will be contacted with the lab results as soon as they are available. The fastest way to get your results is to activate your My Chart account. Instructions are located on the last page of this paperwork. If you have not heard from [place] regarding the results in 2 weeks, please contact this office.

## 2020-04-30 IMAGING — CR DG HAND COMPLETE 3+V*R*
3 series · 3 of 3 positions shown · non-contrast
Comparison: None.
COMPARISON: None.

Addendum:
CLINICAL DATA: Punched a wall

EXAM:
RIGHT HAND - COMPLETE 3+ VIEW

[x hand pa right]
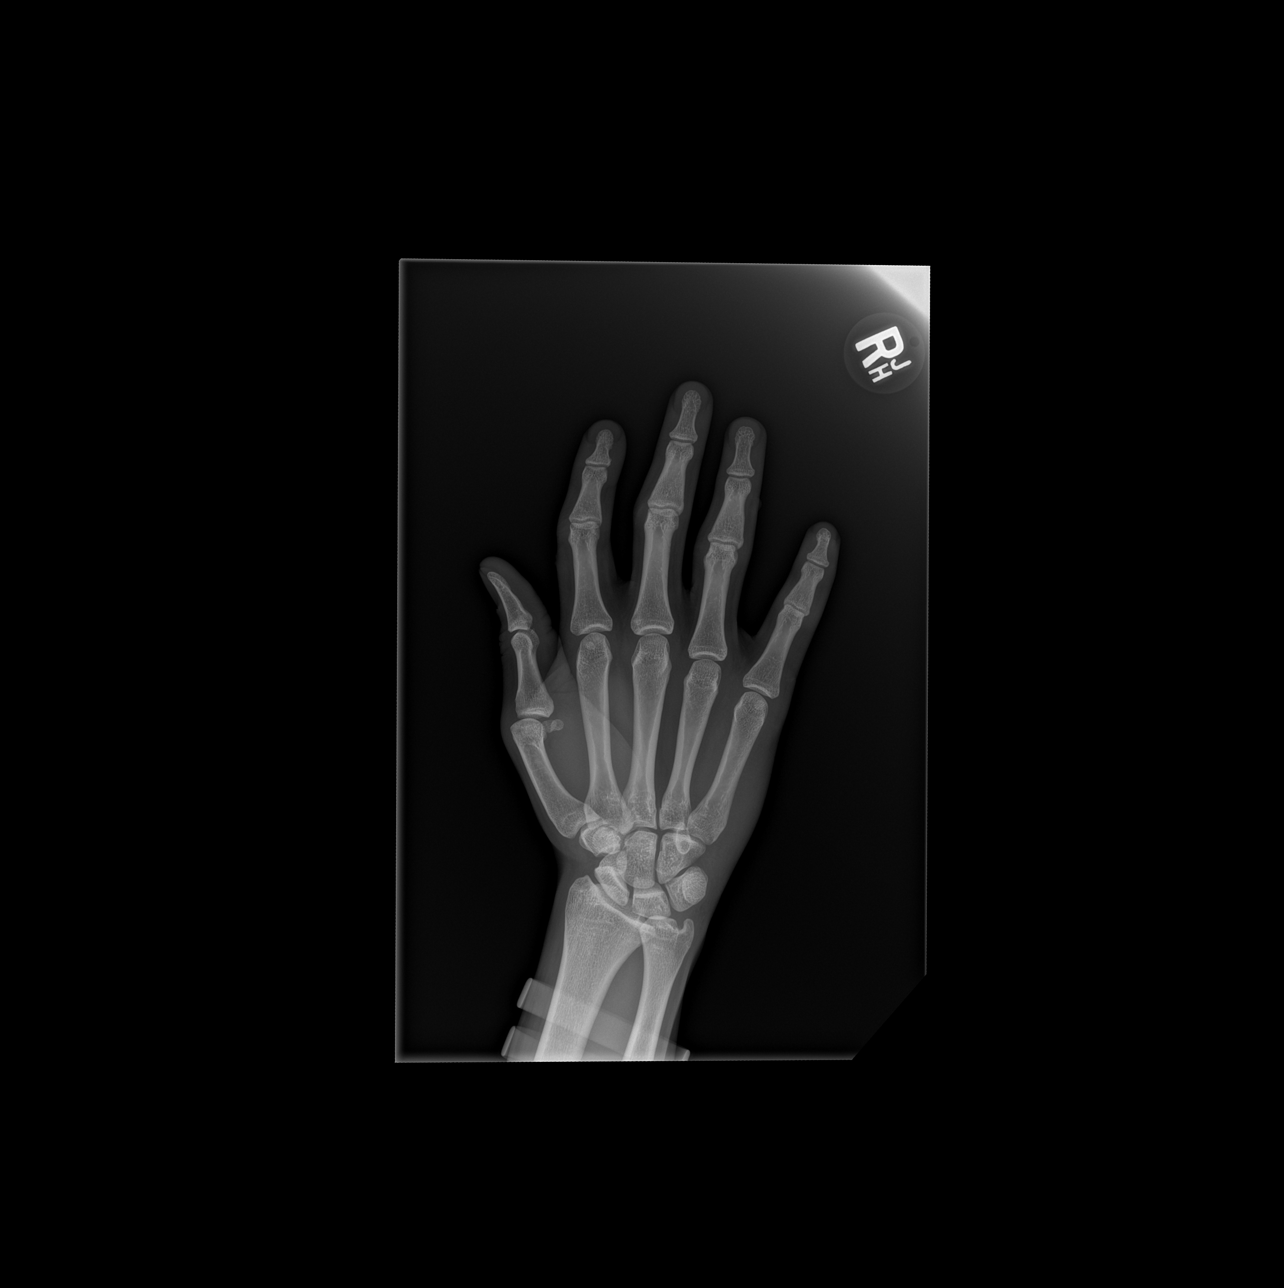

[x hand obl right]
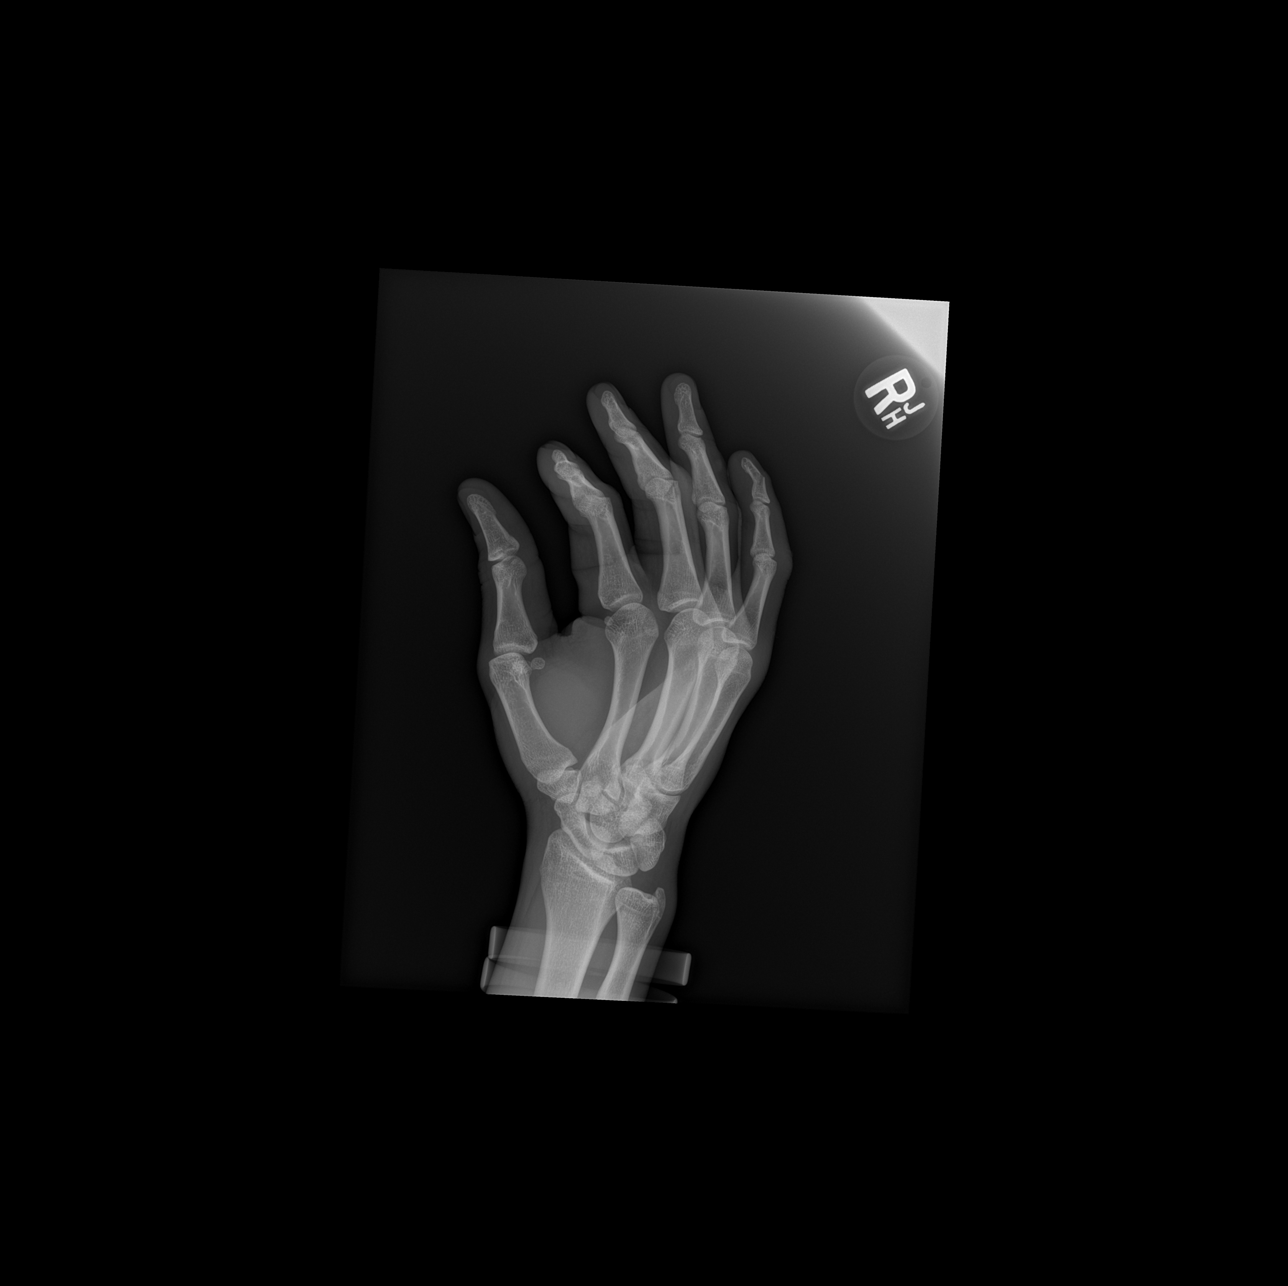

[x hand lat right]
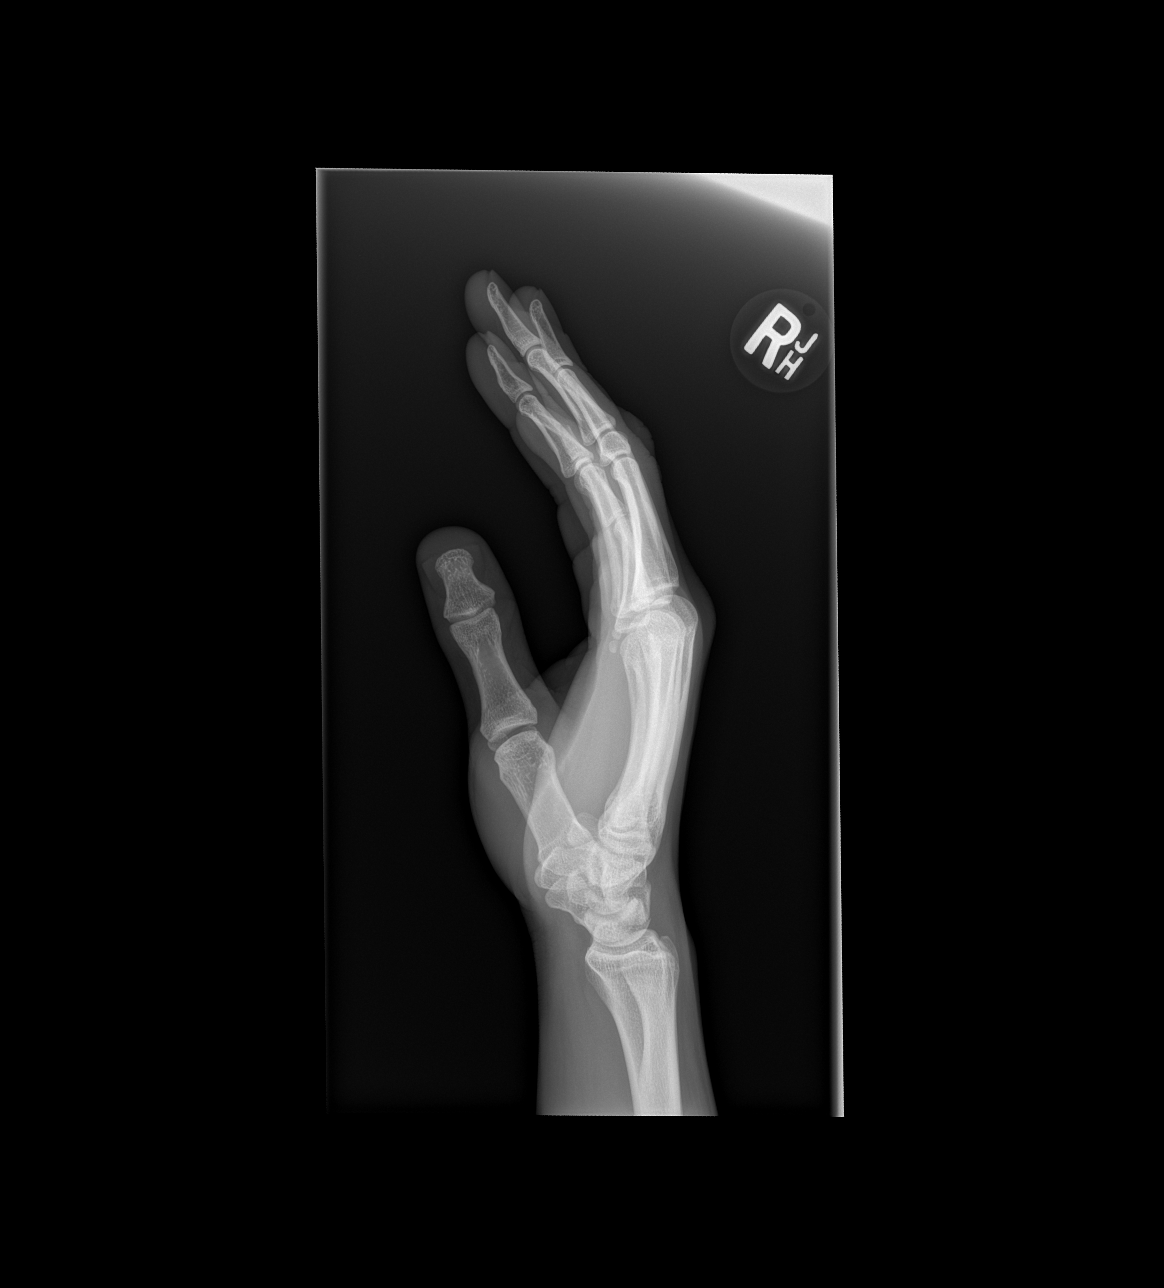

[3 of 3 positions shown; findings below may reference images not displayed]

FINDINGS: There is no evidence of fracture or dislocation. There is no
evidence of arthropathy or other focal bone abnormality. Soft
tissues are unremarkable.
IMPRESSION: Negative.

ADDENDUM:
Upon further review and following discussion with the ordering
provider post clinical exam additional findings are as below:

There is a displaced, angulated fracture of the fifth distal phalanx
with minimal overlying soft tissue swelling. No other fracture or
traumatic malalignment.

Addendum was called by telephone at the time submission on 08/18/2019
at [DATE] to provider AUNTYJATTY DELOWR , who verbally acknowledged
these results.

*** End of Addendum ***
FINDINGS: There is no evidence of fracture or dislocation. There is no
evidence of arthropathy or other focal bone abnormality. Soft
tissues are unremarkable.
IMPRESSION: Negative.

## 2023-10-07 ENCOUNTER — Encounter: Payer: Self-pay | Admitting: Family Medicine
